# Patient Record
Sex: Male | Born: 1995 | Race: Black or African American | Hispanic: No | Marital: Single | Smoking: Current some day smoker
Health system: Southern US, Community
[De-identification: ages and names within clinical notes are randomized; demographics above are authoritative.]

## PROBLEM LIST (undated history)

## (undated) ENCOUNTER — Emergency Department (HOSPITAL_COMMUNITY): Discharge status: Absent without leave | Payer: Self-pay

## (undated) DIAGNOSIS — J45909 Unspecified asthma, uncomplicated: Secondary | ICD-10-CM

---
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
## 2017-02-01 ENCOUNTER — Emergency Department (HOSPITAL_COMMUNITY)
Admission: EM | Admit: 2017-02-01 | Discharge: 2017-02-01 | Disposition: A | Discharge status: Home / Self Care | Payer: Medicaid Other | Attending: Emergency Medicine | Admitting: Emergency Medicine

## 2017-02-01 ENCOUNTER — Emergency Department (HOSPITAL_COMMUNITY): Payer: Medicaid Other

## 2017-02-01 ENCOUNTER — Encounter (HOSPITAL_COMMUNITY): Payer: Self-pay | Admitting: Nurse Practitioner

## 2017-02-01 DIAGNOSIS — J45909 Unspecified asthma, uncomplicated: Secondary | ICD-10-CM | POA: Insufficient documentation

## 2017-02-01 DIAGNOSIS — R079 Chest pain, unspecified: Secondary | ICD-10-CM

## 2017-02-01 DIAGNOSIS — F419 Anxiety disorder, unspecified: Secondary | ICD-10-CM | POA: Diagnosis not present

## 2017-02-01 DIAGNOSIS — F17200 Nicotine dependence, unspecified, uncomplicated: Secondary | ICD-10-CM | POA: Diagnosis not present

## 2017-02-01 HISTORY — DX: Unspecified asthma, uncomplicated: J45.909

## 2017-02-01 LAB — CBC
HCT: 45.5 % (ref 39.0–52.0)
HEMOGLOBIN: 15 g/dL (ref 13.0–17.0)
MCH: 27.4 pg (ref 26.0–34.0)
MCHC: 33 g/dL (ref 30.0–36.0)
MCV: 83.2 fL (ref 78.0–100.0)
Platelets: 207 10*3/uL (ref 150–400)
RBC: 5.47 MIL/uL (ref 4.22–5.81)
RDW: 14 % (ref 11.5–15.5)
WBC: 8.4 10*3/uL (ref 4.0–10.5)

## 2017-02-01 LAB — I-STAT TROPONIN, ED: TROPONIN I, POC: 0 ng/mL (ref 0.00–0.08)

## 2017-02-01 LAB — BASIC METABOLIC PANEL
Anion gap: 12 (ref 5–15)
BUN: 11 mg/dL (ref 6–20)
CALCIUM: 9.3 mg/dL (ref 8.9–10.3)
CO2: 23 mmol/L (ref 22–32)
CREATININE: 1.03 mg/dL (ref 0.61–1.24)
Chloride: 105 mmol/L (ref 101–111)
GFR calc Af Amer: >60 mL/min (ref >60)
GLUCOSE: 97 mg/dL (ref 65–99)
Potassium: 3.2 mmol/L — ABNORMAL LOW (ref 3.5–5.1)
Sodium: 140 mmol/L (ref 135–145)

## 2017-02-01 MED ORDER — HYDROXYZINE HCL 25 MG PO TABS: 25.0000 mg | ORAL_TABLET | Freq: Four times a day (QID) | ORAL | 0 refills | Status: DC | PRN

## 2017-02-01 MED ORDER — HYDROXYZINE HCL 25 MG PO TABS: 25.0000 mg | ORAL_TABLET | Freq: Four times a day (QID) | ORAL | 0 refills | Status: DC

## 2017-02-01 NOTE — ED Provider Notes (Signed)
[facility]-EMERGENCY DEPT Provider Note   CSN: [id number] Arrival date & time: 02/01/17  1820     History   Chief Complaint Chief Complaint  Patient presents with  . Chest Pain    HPI Massimo Theissen is a 21 y.o. male.  Patient states that for the past 2 weeks.  He's noticed that he develops chest pain when he becomes anxious or starts worrying about things.  It lasts about an hour.  He goes away with rest.  It does not cause any nausea or shortness of breath. He states he is dressing over his child and the lack of employment. Denies any recreational drug use or alcohol use      Past Medical History:  Diagnosis Date  . Asthma     There are no active problems to display for this patient.   History reviewed. No pertinent surgical history.     Home Medications    Prior to Admission medications   Medication Sig Start Date End Date Taking? Authorizing Provider  hydrOXYzine (ATARAX/VISTARIL) 25 MG tablet Take 1 tablet (25 mg total) by mouth every 6 (six) hours. 02/01/17   Earley Favor, NP  hydrOXYzine (ATARAX/VISTARIL) 25 MG tablet Take 1 tablet (25 mg total) by mouth every 6 (six) hours as needed for anxiety. 02/01/17   Earley Favor, NP    Family History History reviewed. No pertinent family history.  Social History Social History  Substance Use Topics  . Smoking status: Current Some Day Smoker  . Smokeless tobacco: Never Used  . Alcohol use No     Allergies   Patient has no known allergies.   Review of Systems Review of Systems  Respiratory: Negative for shortness of breath.   Cardiovascular: Positive for chest pain.  Gastrointestinal: Negative for nausea.  Neurological: Negative for syncope.  Psychiatric/Behavioral: The patient is nervous/anxious.   All other systems reviewed and are negative.    Physical Exam Updated Vital Signs BP 145/86 (BP Location: Right Arm)   Pulse 89   Temp 98.9 F (37.2 C) (Oral)   Resp 16   Ht 5\' 6"  (1.676 m)   Wt 59 kg    SpO2 100%   BMI 20.98 kg/m   Physical Exam  Constitutional: He appears well-developed and well-nourished.  HENT:  Head: Normocephalic.  Eyes: Pupils are equal, round, and reactive to light.  Cardiovascular: Normal rate.   Pulmonary/Chest: Effort normal.  Abdominal: Soft.  Musculoskeletal: Normal range of motion.  Neurological: He is alert.  Skin: Skin is warm.  Nursing note and vitals reviewed.    ED Treatments / Results  Labs (all labs ordered are listed, but only abnormal results are displayed) Labs Reviewed  BASIC METABOLIC PANEL - Abnormal; Notable for the following:       Result Value   Potassium 3.2 (*)    All other components within normal limits  CBC  I-STAT TROPOININ, ED    EKG  EKG Interpretation None       Radiology Dg Chest 2 View  Result Date: 02/01/2017 CLINICAL DATA:  Chest pain for several days EXAM: CHEST  2 VIEW COMPARISON:  None. FINDINGS: The heart size and mediastinal contours are within normal limits. Both lungs are clear. The visualized skeletal structures are unremarkable. IMPRESSION: No active cardiopulmonary disease. Electronically Signed   By: Alcide Clever M.D.   On: 02/01/2017 19:45    Procedures Procedures (including critical care time)  Medications Ordered in ED Medications - No data to display   Initial Impression /  Assessment and Plan / ED Course  I have reviewed the triage vital signs and the nursing notes.  Pertinent labs & imaging results that were available during my care of the patient were reviewed by me and considered in my medical decision making (see chart for details).      Will prescribe Vistaril when necessary and follow-up with primary care physician I did discuss some deep breathing techniques and meditation Also suggest that when he changes positions that he do this more slowly rather than just pop up and move as he does report that he occasionally will get dizzy when he changes position.  After he is been  sitting or lying for long period of time  Final Clinical Impressions(s) / ED Diagnoses   Final diagnoses:  Chest pain, unspecified type  Anxiety    New Prescriptions New Prescriptions   HYDROXYZINE (ATARAX/VISTARIL) 25 MG TABLET    Take 1 tablet (25 mg total) by mouth every 6 (six) hours.   HYDROXYZINE (ATARAX/VISTARIL) 25 MG TABLET    Take 1 tablet (25 mg total) by mouth every 6 (six) hours as needed for anxiety.     Earley FavorGail Lennart Gladish, NP 02/01/17 2106    Lyndal Pulleyaniel Knott, MD 02/02/17 [phone]

## 2017-02-01 NOTE — ED Triage Notes (Signed)
Pt presents with c/o CP. The pain began about 10 days ago and has been intermittent since onset. The pain is midsternal and radiates into his back. The pain does not seem to be related to any one activity or time of the day. He reports dizziness, SOB. He denies LOC, fevers, cough. He reports recent increased stress in his life.

## 2017-10-05 ENCOUNTER — Emergency Department (HOSPITAL_COMMUNITY)
Admission: EM | Admit: 2017-10-05 | Discharge: 2017-10-05 | Disposition: A | Discharge status: Home / Self Care | Payer: Medicaid Other | Attending: Emergency Medicine | Admitting: Emergency Medicine

## 2017-10-05 DIAGNOSIS — J45909 Unspecified asthma, uncomplicated: Secondary | ICD-10-CM | POA: Diagnosis not present

## 2017-10-05 DIAGNOSIS — Z79899 Other long term (current) drug therapy: Secondary | ICD-10-CM | POA: Insufficient documentation

## 2017-10-05 DIAGNOSIS — J029 Acute pharyngitis, unspecified: Secondary | ICD-10-CM | POA: Diagnosis present

## 2017-10-05 DIAGNOSIS — J020 Streptococcal pharyngitis: Secondary | ICD-10-CM | POA: Insufficient documentation

## 2017-10-05 DIAGNOSIS — F17210 Nicotine dependence, cigarettes, uncomplicated: Secondary | ICD-10-CM | POA: Diagnosis not present

## 2017-10-05 LAB — RAPID STREP SCREEN (MED CTR MEBANE ONLY): Streptococcus, Group A Screen (Direct): POSITIVE — AB

## 2017-10-05 MED ORDER — MORPHINE SULFATE (PF) 4 MG/ML IV SOLN
4.0000 mg | Freq: Once | INTRAVENOUS | Status: AC
  Administered 2017-10-05: 4 mg via INTRAMUSCULAR
  Filled 2017-10-05: qty 1

## 2017-10-05 MED ORDER — IBUPROFEN 600 MG PO TABS: 600.0000 mg | ORAL_TABLET | Freq: Four times a day (QID) | ORAL | 0 refills | Status: DC | PRN

## 2017-10-05 MED ORDER — TRAMADOL HCL 50 MG PO TABS: 50.0000 mg | ORAL_TABLET | Freq: Four times a day (QID) | ORAL | 0 refills | Status: DC | PRN

## 2017-10-05 MED ORDER — PENICILLIN G BENZATHINE 1200000 UNIT/2ML IM SUSP
1.2000 10*6.[IU] | Freq: Once | INTRAMUSCULAR | Status: AC
  Administered 2017-10-05: 1.2 10*6.[IU] via INTRAMUSCULAR
  Filled 2017-10-05: qty 2

## 2017-10-05 MED ORDER — DEXAMETHASONE 4 MG PO TABS
12.0000 mg | ORAL_TABLET | Freq: Once | ORAL | Status: AC
  Administered 2017-10-05: 12 mg via ORAL
  Filled 2017-10-05: qty 3

## 2017-10-05 MED ORDER — IBUPROFEN 400 MG PO TABS
600.0000 mg | ORAL_TABLET | Freq: Once | ORAL | Status: AC
  Administered 2017-10-05: 19:00:00 600 mg via ORAL
  Filled 2017-10-05: qty 1

## 2017-10-05 NOTE — ED Provider Notes (Signed)
[facility] EMERGENCY DEPARTMENT Provider Note   CSN: [id number] Arrival date & time: 10/05/17  1500     History   Chief Complaint Chief Complaint  Patient presents with  . Sore Throat    HPI Fernando Drake is a 21 y.o. male.  HPI   21 year old male sore throat. Onset 2 days ago. Persistent since then. No cough. Subjective fever. Hasn't tried taking anything for her symptoms. Otherwise healthy.  Past Medical History:  Diagnosis Date  . Asthma     There are no active problems to display for this patient.   No past surgical history on file.     Home Medications    Prior to Admission medications   Medication Sig Start Date End Date Taking? Authorizing Provider  hydrOXYzine (ATARAX/VISTARIL) 25 MG tablet Take 1 tablet (25 mg total) by mouth every 6 (six) hours. 02/01/17   Earley Favor, NP  hydrOXYzine (ATARAX/VISTARIL) 25 MG tablet Take 1 tablet (25 mg total) by mouth every 6 (six) hours as needed for anxiety. 02/01/17   Earley Favor, NP    Family History No family history on file.  Social History Social History  Substance Use Topics  . Smoking status: Current Some Day Smoker  . Smokeless tobacco: Never Used  . Alcohol use No     Allergies   Patient has no known allergies.   Review of Systems Review of Systems  All systems reviewed and negative, other than as noted in HPI.  Physical Exam Updated Vital Signs BP 119/66 (BP Location: Left Arm)   Pulse (!) 56   Temp 98.8 F (37.1 C) (Oral)   Resp 12   Ht  (1.702 m)   Wt 63.5 kg (140 lb)   SpO2 99%   BMI 21.93 kg/m   Physical Exam  Constitutional: He appears well-developed and well-nourished. No distress.  HENT:  Head: Normocephalic and atraumatic.  Pharyngitis/tonsillitis. No exudate noted. Uvula midline. Normal sounding voice. Handling secretions. Neck is supple. Tender right-sided cervical adenopathy. No stridor.  Eyes: Conjunctivae are normal. Right eye exhibits no  discharge. Left eye exhibits no discharge.  Neck: Neck supple.  Cardiovascular: Normal rate, regular rhythm and normal heart sounds.  Exam reveals no gallop and no friction rub.   No murmur heard. Pulmonary/Chest: Effort normal and breath sounds normal. No respiratory distress.  Abdominal: Soft. He exhibits no distension. There is no tenderness.  Musculoskeletal: He exhibits no edema or tenderness.  Neurological: He is alert.  Skin: Skin is warm and dry.  Psychiatric: He has a normal mood and affect. His behavior is normal. Thought content normal.  Nursing note and vitals reviewed.    ED Treatments / Results  Labs (all labs ordered are listed, but only abnormal results are displayed) Labs Reviewed  RAPID STREP SCREEN (NOT AT [facility])    EKG  EKG Interpretation None       Radiology No results found.  Procedures Procedures (including critical care time)  Medications Ordered in ED Medications  dexamethasone (DECADRON) tablet 12 mg (not administered)  ibuprofen (ADVIL,MOTRIN) tablet 600 mg (not administered)  morphine 4 MG/ML injection 4 mg (not administered)     Initial Impression / Assessment and Plan / ED Course  I have reviewed the triage vital signs and the nursing notes.  Pertinent labs & imaging results that were available during my care of the patient were reviewed by me and considered in my medical decision making (see chart for details).    Pharyngitis without  evidence or compromise. Nontoxic. Rapid strep positive. IM Bicillin. Sinemet treatment otherwise. Return cautions discussed.  Final Clinical Impressions(s) / ED Diagnoses   Final diagnoses:  Strep throat    New Prescriptions New Prescriptions   No medications on file     Raeford Razor, MD 10/07/17 1205

## 2017-10-05 NOTE — ED Triage Notes (Signed)
Pt c/o of sore throat x 2 days with red inflamed tonsils.

## 2017-11-14 DIAGNOSIS — J029 Acute pharyngitis, unspecified: Secondary | ICD-10-CM | POA: Insufficient documentation

## 2017-11-14 DIAGNOSIS — Z79899 Other long term (current) drug therapy: Secondary | ICD-10-CM | POA: Insufficient documentation

## 2017-11-14 DIAGNOSIS — F17200 Nicotine dependence, unspecified, uncomplicated: Secondary | ICD-10-CM | POA: Diagnosis not present

## 2017-11-14 DIAGNOSIS — J45909 Unspecified asthma, uncomplicated: Secondary | ICD-10-CM | POA: Diagnosis not present

## 2017-11-14 DIAGNOSIS — R070 Pain in throat: Secondary | ICD-10-CM | POA: Diagnosis present

## 2017-11-15 ENCOUNTER — Emergency Department (HOSPITAL_COMMUNITY)
Admission: EM | Admit: 2017-11-15 | Discharge: 2017-11-15 | Disposition: A | Discharge status: Home / Self Care | Payer: Medicaid Other | Attending: Emergency Medicine | Admitting: Emergency Medicine

## 2017-11-15 ENCOUNTER — Other Ambulatory Visit: Payer: Self-pay

## 2017-11-15 ENCOUNTER — Encounter (HOSPITAL_COMMUNITY): Payer: Self-pay | Admitting: Emergency Medicine

## 2017-11-15 DIAGNOSIS — J029 Acute pharyngitis, unspecified: Secondary | ICD-10-CM

## 2017-11-15 LAB — RAPID STREP SCREEN (MED CTR MEBANE ONLY): Streptococcus, Group A Screen (Direct): NEGATIVE

## 2017-11-15 MED ORDER — DEXAMETHASONE 4 MG PO TABS
10.0000 mg | ORAL_TABLET | Freq: Once | ORAL | Status: AC
  Administered 2017-11-15: 10 mg via ORAL
  Filled 2017-11-15: qty 3

## 2017-11-15 NOTE — ED Notes (Signed)
Sherry PA at the bedside at this time.

## 2017-11-15 NOTE — ED Provider Notes (Signed)
[facility]   CSN: [id number] Arrival date & time: 11/14/17  2358     History   Chief Complaint Chief Complaint  Patient presents with  . Sore Throat    HPI Fernando Drake is a 21 y.o. male.  Patient presents with sore throat for the past 3 days. No fever. He denies significant congestion, cough. No nausea or vomiting. He has taken ibuprofen without relief.   The history is provided by the patient. No language interpreter was used.    Past Medical History:  Diagnosis Date  . Asthma     There are no active problems to display for this patient.   History reviewed. No pertinent surgical history.     Home Medications    Prior to Admission medications   Medication Sig Start Date End Date Taking? Authorizing Provider  hydrOXYzine (ATARAX/VISTARIL) 25 MG tablet Take 1 tablet (25 mg total) by mouth every 6 (six) hours. 02/01/17   Earley FavorSchulz, Gail, NP  hydrOXYzine (ATARAX/VISTARIL) 25 MG tablet Take 1 tablet (25 mg total) by mouth every 6 (six) hours as needed for anxiety. 02/01/17   Earley FavorSchulz, Gail, NP  ibuprofen (ADVIL,MOTRIN) 600 MG tablet Take 1 tablet (600 mg total) by mouth every 6 (six) hours as needed. 10/05/17   Raeford RazorKohut, Stephen, MD  traMADol (ULTRAM) 50 MG tablet Take 1 tablet (50 mg total) by mouth every 6 (six) hours as needed. 10/05/17   Raeford RazorKohut, Stephen, MD    Family History No family history on file.  Social History Social History   Tobacco Use  . Smoking status: Current Some Day Smoker  . Smokeless tobacco: Never Used  Substance Use Topics  . Alcohol use: No  . Drug use: No     Allergies   Patient has no known allergies.   Review of Systems Review of Systems  Constitutional: Negative for chills and fever.  HENT: Positive for sore throat. Negative for congestion and trouble swallowing.   Respiratory: Negative for cough and shortness of breath.   Cardiovascular: Negative for chest pain.       Chest  pain with cough.  Gastrointestinal: Negative.  Negative for nausea and vomiting.  Musculoskeletal: Negative.   Skin: Negative.   Neurological: Negative.      Physical Exam Updated Vital Signs BP 117/81 (BP Location: Right Arm)   Pulse 86   Temp 99.3 F (37.4 C) (Oral)   Resp 18   Ht 5\' 6"  (1.676 m)   Wt 68 kg (150 lb)   SpO2 99%   BMI 24.21 kg/m   Physical Exam  Constitutional: He appears well-developed and well-nourished.  HENT:  Head: Normocephalic.  Mouth/Throat: Uvula is midline and mucous membranes are normal. Posterior oropharyngeal erythema present. No oropharyngeal exudate.  Neck: Normal range of motion. Neck supple.  Cardiovascular: Normal rate and regular rhythm.  Pulmonary/Chest: Effort normal and breath sounds normal.  Abdominal: Soft. Bowel sounds are normal. There is no tenderness. There is no rebound and no guarding.  Musculoskeletal: Normal range of motion.  Neurological: He is alert. No cranial nerve deficit.  Skin: Skin is warm and dry. No rash noted.  Psychiatric: He has a normal mood and affect.     ED Treatments / Results  Labs (all labs ordered are listed, but only abnormal results are displayed) Labs Reviewed  RAPID STRE[facility] SCREEN (NOT AT [facility])  CULTURE, GROUP A STREP [facility])    EKG  EKG Interpretation None  Radiology No results found.  Procedures Procedures (including critical care time)  Medications Ordered in ED Medications  dexamethasone (DECADRON) tablet 10 mg (not administered)     Initial Impression / Assessment and Plan / ED Course  I have reviewed the triage vital signs and the nursing notes.  Pertinent labs & imaging results that were available during my care of the patient were reviewed by me and considered in my medical decision making (see chart for details).     Patient with sore throat x 3 days. No fever. Negative strep. No other symptoms. Likely viral requiring supportive management.   Final Clinical  Impressions(s) / ED Diagnoses   Final diagnoses:  Pharyngitis, unspecified etiology    ED Discharge Orders    None       Elpidio AnisUpstill, Keithon Mccoin, PA-C 11/16/17 0041    Geoffery Lyonselo, Douglas, MD 11/16/17 [phone]

## 2017-11-15 NOTE — ED Notes (Signed)
No response when pt. called back to room.

## 2017-11-15 NOTE — Discharge Instructions (Signed)
Continue ibuprofen 600 mg every 6 hours. Drink lots of ice cold fluids, continue to gargle with warm salt water.

## 2017-11-15 NOTE — ED Triage Notes (Signed)
Pt c/o sore throat x 3 days, denies fever, denies n/v/d

## 2017-11-17 LAB — CULTURE, GROUP A STREP (THRC)

## 2018-03-05 ENCOUNTER — Encounter (HOSPITAL_COMMUNITY): Payer: Self-pay

## 2018-03-05 ENCOUNTER — Other Ambulatory Visit: Payer: Self-pay

## 2018-03-05 ENCOUNTER — Emergency Department (HOSPITAL_COMMUNITY)
Admission: EM | Admit: 2018-03-05 | Discharge: 2018-03-05 | Disposition: A | Discharge status: Home / Self Care | Payer: Medicaid Other | Attending: Emergency Medicine | Admitting: Emergency Medicine

## 2018-03-05 ENCOUNTER — Emergency Department (HOSPITAL_COMMUNITY): Payer: Medicaid Other

## 2018-03-05 DIAGNOSIS — F17200 Nicotine dependence, unspecified, uncomplicated: Secondary | ICD-10-CM | POA: Insufficient documentation

## 2018-03-05 DIAGNOSIS — W25XXXA Contact with sharp glass, initial encounter: Secondary | ICD-10-CM | POA: Insufficient documentation

## 2018-03-05 DIAGNOSIS — Y999 Unspecified external cause status: Secondary | ICD-10-CM | POA: Diagnosis not present

## 2018-03-05 DIAGNOSIS — Y9389 Activity, other specified: Secondary | ICD-10-CM | POA: Insufficient documentation

## 2018-03-05 DIAGNOSIS — J45909 Unspecified asthma, uncomplicated: Secondary | ICD-10-CM | POA: Diagnosis not present

## 2018-03-05 DIAGNOSIS — S61012A Laceration without foreign body of left thumb without damage to nail, initial encounter: Secondary | ICD-10-CM | POA: Insufficient documentation

## 2018-03-05 DIAGNOSIS — Z79899 Other long term (current) drug therapy: Secondary | ICD-10-CM | POA: Insufficient documentation

## 2018-03-05 DIAGNOSIS — S6992XA Unspecified injury of left wrist, hand and finger(s), initial encounter: Secondary | ICD-10-CM | POA: Diagnosis present

## 2018-03-05 DIAGNOSIS — S61213A Laceration without foreign body of left middle finger without damage to nail, initial encounter: Secondary | ICD-10-CM | POA: Insufficient documentation

## 2018-03-05 DIAGNOSIS — S61011A Laceration without foreign body of right thumb without damage to nail, initial encounter: Secondary | ICD-10-CM

## 2018-03-05 DIAGNOSIS — Y929 Unspecified place or not applicable: Secondary | ICD-10-CM | POA: Insufficient documentation

## 2018-03-05 DIAGNOSIS — Z23 Encounter for immunization: Secondary | ICD-10-CM | POA: Insufficient documentation

## 2018-03-05 MED ORDER — BACITRACIN ZINC 500 UNIT/GM EX OINT
TOPICAL_OINTMENT | Freq: Two times a day (BID) | CUTANEOUS | Status: DC
  Administered 2018-03-05: 20:00:00 via TOPICAL

## 2018-03-05 MED ORDER — LIDOCAINE HCL (PF) 1 % IJ SOLN
5.0000 mL | Freq: Once | INTRAMUSCULAR | Status: AC
  Administered 2018-03-05: 5 mL
  Filled 2018-03-05: qty 5

## 2018-03-05 MED ORDER — TETANUS-DIPHTH-ACELL PERTUSSIS 5-2.5-18.5 LF-MCG/0.5 IM SUSP
0.5000 mL | Freq: Once | INTRAMUSCULAR | Status: AC
  Administered 2018-03-05: 0.5 mL via INTRAMUSCULAR
  Filled 2018-03-05: qty 0.5

## 2018-03-05 NOTE — Discharge Instructions (Signed)
Follow up for suture removal in 10 days. Return sooner for any problems.
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
## 2018-03-16 ENCOUNTER — Ambulatory Visit (HOSPITAL_COMMUNITY)
Admission: EM | Admit: 2018-03-16 | Discharge: 2018-03-16 | Disposition: A | Discharge status: Home / Self Care | Payer: Medicaid Other

## 2018-03-16 DIAGNOSIS — Z4802 Encounter for removal of sutures: Secondary | ICD-10-CM

## 2018-03-16 NOTE — ED Triage Notes (Signed)
Pt here for suture removal of left thumb.  Wound is clean, dry, intact, and approximated.  No drainage from the wound.  Five sutures were removed from the wound and pt tolerated it well.  Pt instructed on wound care post suture removal.  Pt stated understanding.
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
## 2020-12-02 ENCOUNTER — Encounter (HOSPITAL_COMMUNITY): Payer: Self-pay

## 2020-12-02 ENCOUNTER — Other Ambulatory Visit: Payer: Self-pay

## 2020-12-02 ENCOUNTER — Ambulatory Visit (HOSPITAL_COMMUNITY)
Admission: EM | Admit: 2020-12-02 | Discharge: 2020-12-02 | Disposition: A | Discharge status: Home / Self Care | Payer: Medicaid Other | Attending: Internal Medicine | Admitting: Internal Medicine

## 2020-12-02 DIAGNOSIS — K047 Periapical abscess without sinus: Secondary | ICD-10-CM | POA: Diagnosis not present

## 2020-12-02 MED ORDER — AMOXICILLIN-POT CLAVULANATE 875-125 MG PO TABS: 1.0000 | ORAL_TABLET | Freq: Two times a day (BID) | ORAL | 0 refills | Status: AC

## 2020-12-02 NOTE — ED Provider Notes (Signed)
[facility]    CSN: [id number] Arrival date & time: 12/02/20  1734      History   Chief Complaint Chief Complaint  Patient presents with  . Abscess    Right side bottom gum    HPI Fernando Drake is a 24 y.o. male presents to urgent care with dental pain and right facial swelling.  Patient states symptoms began approximately 4 days ago.  He denies any drainage, no fever or chills.  Patient aware of tooth that needs to be extracted though uncertain where to follow-up with dentist.   Past Medical History:  Diagnosis Date  . Asthma     There are no problems to display for this patient.   History reviewed. No pertinent surgical history.    Home Medications    Prior to Admission medications   Medication Sig Start Date End Date Taking? Authorizing Provider  amoxicillin-clavulanate (AUGMENTIN) 875-125 MG tablet Take 1 tablet by mouth 2 (two) times daily for 14 days. 12/02/20 12/16/20  Rolla Etienne, NP  hydrOXYzine (ATARAX/VISTARIL) 25 MG tablet Take 1 tablet (25 mg total) by mouth every 6 (six) hours. 02/01/17   Earley Favor, NP  hydrOXYzine (ATARAX/VISTARIL) 25 MG tablet Take 1 tablet (25 mg total) by mouth every 6 (six) hours as needed for anxiety. 02/01/17   Earley Favor, NP  ibuprofen (ADVIL,MOTRIN) 600 MG tablet Take 1 tablet (600 mg total) by mouth every 6 (six) hours as needed. 10/05/17   Raeford Razor, MD  traMADol (ULTRAM) 50 MG tablet Take 1 tablet (50 mg total) by mouth every 6 (six) hours as needed. 10/05/17   Raeford Razor, MD    Family History History reviewed. No pertinent family history.  Social History Social History   Tobacco Use  . Smoking status: Current Some Day Smoker  . Smokeless tobacco: Never Used  Substance Use Topics  . Alcohol use: No  . Drug use: No     Allergies   Patient has no known allergies.   Review of Systems As stated in HPI otherwise negative   Physical Exam Triage Vital Signs ED Triage Vitals  Enc  Vitals Group     BP 12/02/20 1745 113/70     Pulse Rate 12/02/20 1745 91     Resp 12/02/20 1745 16     Temp 12/02/20 1745 98.9 F (37.2 C)     Temp Source 12/02/20 1745 Oral     SpO2 12/02/20 1745 96 %     Weight --      Height --      Head Circumference --      Peak Flow --      Pain Score 12/02/20 1747 8     Pain Loc --      Pain Edu? --      Excl. in GC? --    No data found.  Updated Vital Signs BP 113/70 (BP Location: Right Arm)   Pulse 91   Temp 98.9 F (37.2 C) (Oral)   Resp 16   SpO2 96%      Physical Exam Constitutional:      General: He is not in acute distress.    Appearance: Normal appearance. He is not ill-appearing.  HENT:     Mouth/Throat:     Mouth: Mucous membranes are moist.     Pharynx: No oropharyngeal exudate.     Comments: Swelling and tenderness upon palpation to right lower gumline Skin:    General: Skin is warm and dry.  Neurological:     Mental Status: He is alert.  Psychiatric:        Mood and Affect: Mood normal.        Behavior: Behavior normal.      UC Treatments / Results  Labs (all labs ordered are listed, but only abnormal results are displayed) Labs Reviewed - No data to display  EKG   Radiology No results found.  Procedures Procedures (including critical care time)  Medications Ordered in UC Medications - No data to display  Initial Impression / Assessment and Plan / UC Course  I have reviewed the triage vital signs and the nursing notes.  Pertinent labs & imaging results that were available during my care of the patient were reviewed by me and considered in my medical decision making (see chart for details).  Dental abscess -Augmentin p.o. twice daily x14 days -Referral for tooth extraction  Reviewed expections re: course of current medical issues. Questions answered. Outlined signs and symptoms indicating need for more acute intervention. Pt verbalized understanding. AVS given   Final Clinical  Impressions(s) / UC Diagnoses   Final diagnoses:  Dental abscess     Discharge Instructions     Start antibiotic tonight for dental abscess.  I have given you a number to call to schedule appointment to be seen by dentist    ED Prescriptions    Medication Sig Dispense Auth. Provider   amoxicillin-clavulanate (AUGMENTIN) 875-125 MG tablet Take 1 tablet by mouth 2 (two) times daily for 14 days. 28 tablet Rolla Etienne, NP     PDMP not reviewed this encounter.   Rolla Etienne, NP 12/02/20 1927

## 2020-12-02 NOTE — ED Triage Notes (Signed)
PT present an abscess on his bottom gums. Pt states the area is painful. Symptoms started three days ago.

## 2020-12-02 NOTE — Discharge Instructions (Signed)
Start antibiotic tonight for dental abscess.  I have given you a number to call to schedule appointment to be seen by dentist

## 2021-06-09 ENCOUNTER — Ambulatory Visit (HOSPITAL_COMMUNITY)
Admission: EM | Admit: 2021-06-09 | Discharge: 2021-06-09 | Disposition: A | Discharge status: Home / Self Care | Payer: Medicaid Other

## 2022-04-19 ENCOUNTER — Encounter (HOSPITAL_COMMUNITY): Payer: Self-pay | Admitting: Emergency Medicine

## 2022-04-19 ENCOUNTER — Ambulatory Visit (HOSPITAL_COMMUNITY)
Admission: EM | Admit: 2022-04-19 | Discharge: 2022-04-19 | Disposition: A | Discharge status: Home / Self Care | Payer: Medicaid Other | Attending: Urgent Care | Admitting: Urgent Care

## 2022-04-19 ENCOUNTER — Other Ambulatory Visit: Payer: Self-pay

## 2022-04-19 DIAGNOSIS — J029 Acute pharyngitis, unspecified: Secondary | ICD-10-CM | POA: Diagnosis present

## 2022-04-19 LAB — POCT RAPID STREP A, ED / UC: Streptococcus, Group A Screen (Direct): NEGATIVE

## 2022-04-19 MED ORDER — AMOXICILLIN-POT CLAVULANATE 875-125 MG PO TABS: 1.0000 | ORAL_TABLET | Freq: Two times a day (BID) | ORAL | 0 refills | Status: DC

## 2022-04-19 MED ORDER — PREDNISONE 50 MG PO TABS: 50.0000 mg | ORAL_TABLET | Freq: Every day | ORAL | 0 refills | Status: AC

## 2022-04-19 NOTE — ED Provider Notes (Signed)
?[facility] ? ? ? ?CSN: [id number] ?Arrival date & time: 04/19/22  1004 ? ? ?  ? ?History   ?Chief Complaint ?Chief Complaint  ?Patient presents with  ? Otalgia  ? Sore Throat  ? ? ?HPI ?Fernando Drake is a 26 y.o. male.  ? ?Pleasant 26 year old male presents today with primary complaint of sore throat leading to left ear pain.  He states it started 1 week ago.  He has not been taking over-the-counter medications, he states he "thought it would just go away".  He states the pain is worse on the left side but does hurt him bilaterally.  He denies headache, fever, nuchal rigidity, abdominal pain, nausea, vomiting.  He does not have a cough.  He denies any known exposures. ? ? ?Otalgia ?Associated symptoms: sore throat   ?Sore Throat ? ? ?Past Medical History:  ?Diagnosis Date  ? Asthma   ? ? ?There are no problems to display for this patient. ? ? ?History reviewed. No pertinent surgical history. ? ? ? ? ?Home Medications   ? ?Prior to Admission medications   ?Medication Sig Start Date End Date Taking? Authorizing Provider  ?amoxicillin-clavulanate (AUGMENTIN) 875-125 MG tablet Take 1 tablet by mouth every 12 (twelve) hours. 04/19/22  Yes Cristan Scherzer L, PA  ?predniSONE (DELTASONE) 50 MG tablet Take 1 tablet (50 mg total) by mouth daily with breakfast for 3 days. 04/19/22 04/22/22 Yes Danis Pembleton L, PA  ?hydrOXYzine (ATARAX/VISTARIL) 25 MG tablet Take 1 tablet (25 mg total) by mouth every 6 (six) hours. 02/01/17   Earley FavorSchulz, Gail, NP  ?hydrOXYzine (ATARAX/VISTARIL) 25 MG tablet Take 1 tablet (25 mg total) by mouth every 6 (six) hours as needed for anxiety. 02/01/17   Earley FavorSchulz, Gail, NP  ?ibuprofen (ADVIL,MOTRIN) 600 MG tablet Take 1 tablet (600 mg total) by mouth every 6 (six) hours as needed. 10/05/17   Raeford RazorKohut, Stephen, MD  ?traMADol (ULTRAM) 50 MG tablet Take 1 tablet (50 mg total) by mouth every 6 (six) hours as needed. 10/05/17   Raeford RazorKohut, Stephen, MD  ? ? ?Family History ?History reviewed. No pertinent family  history. ? ?Social History ?Social History  ? ?Tobacco Use  ? Smoking status: Some Days  ? Smokeless tobacco: Never  ?Substance Use Topics  ? Alcohol use: No  ? Drug use: No  ? ? ? ?Allergies   ?Patient has no known allergies. ? ? ?Review of Systems ?Review of Systems  ?HENT:  Positive for ear pain and sore throat.   ?All other systems reviewed and are negative. ? ? ?Physical Exam ?Triage Vital Signs ?ED Triage Vitals  ?Enc Vitals Group  ?   BP 04/19/22 1025 136/77  ?   Pulse Rate 04/19/22 1025 62  ?   Resp 04/19/22 1025 17  ?   Temp 04/19/22 1025 98.4 ?F (36.9 ?C)  ?   Temp Source 04/19/22 1025 Oral  ?   SpO2 04/19/22 1025 96 %  ?   Weight 04/19/22 1024 149 lb 14.6 oz (68 kg)  ?   Height 04/19/22 1024 5\' 6"  (1.676 m)  ?   Head Circumference --   ?   Peak Flow --   ?   Pain Score 04/19/22 1023 10  ?   Pain Loc --   ?   Pain Edu? --   ?   Excl. in GC? --   ? ?No data found. ? ?Updated Vital Signs ?BP 136/77 (BP Location: Left Arm)   Pulse 62  Temp 98.4 ?F (36.9 ?C) (Oral)   Resp 17   Ht 5\' 6"  (1.676 m)   Wt 149 lb 14.6 oz (68 kg)   SpO2 96%   BMI 24.20 kg/m?  ? ?Visual Acuity ?Right Eye Distance:   ?Left Eye Distance:   ?Bilateral Distance:   ? ?Right Eye Near:   ?Left Eye Near:    ?Bilateral Near:    ? ?Physical Exam ?Vitals and nursing note reviewed.  ?Constitutional:   ?   General: He is not in acute distress. ?   Appearance: He is well-developed and normal weight. He is not ill-appearing or toxic-appearing.  ?HENT:  ?   Head: Normocephalic and atraumatic.  ?   Right Ear: Tympanic membrane and ear canal normal. No drainage, swelling or tenderness. No middle ear effusion. Tympanic membrane is not erythematous.  ?   Left Ear: Tympanic membrane and ear canal normal. No drainage, swelling or tenderness.  No middle ear effusion. Tympanic membrane is not erythematous.  ?   Nose: No congestion or rhinorrhea.  ?   Mouth/Throat:  ?   Mouth: Mucous membranes are moist. No oral lesions.  ?   Pharynx: Uvula midline.  Oropharyngeal exudate and posterior oropharyngeal erythema present. No pharyngeal swelling or uvula swelling.  ?   Tonsils: Tonsillar exudate present. No tonsillar abscesses.  ?   Comments: Bilateral rise of palate, no abscess ?Eyes:  ?   Conjunctiva/sclera: Conjunctivae normal.  ?   Pupils: Pupils are equal, round, and reactive to light.  ?Neck:  ?   Thyroid: No thyromegaly.  ?Cardiovascular:  ?   Rate and Rhythm: Normal rate and regular rhythm.  ?   Heart sounds: No murmur heard. ?  No gallop.  ?Pulmonary:  ?   Effort: Pulmonary effort is normal. No respiratory distress.  ?   Breath sounds: Normal breath sounds. No stridor. No wheezing, rhonchi or rales.  ?Chest:  ?   Chest wall: No tenderness.  ?Abdominal:  ?   General: Bowel sounds are normal. There is no distension.  ?   Palpations: Abdomen is soft. There is no mass.  ?   Tenderness: There is no abdominal tenderness. There is no rebound.  ?Musculoskeletal:  ?   Cervical back: Normal range of motion and neck supple.  ?Lymphadenopathy:  ?   Cervical: No cervical adenopathy (bilateral anterior chain, L>R).  ?Skin: ?   General: Skin is warm.  ?   Capillary Refill: Capillary refill takes less than 2 seconds.  ?   Findings: No erythema or rash.  ?Neurological:  ?   General: No focal deficit present.  ?   Mental Status: He is alert and oriented to person, place, and time.  ?Psychiatric:     ?   Mood and Affect: Mood normal.     ?   Behavior: Behavior normal.  ? ? ? ?UC Treatments / Results  ?Labs ?(all labs ordered are listed, but only abnormal results are displayed) ?Labs Reviewed  ?CULTURE, GROUP A STREP [facility])  ?POCT RAPID STREP A, ED / UC  ? ? ?EKG ? ? ?Radiology ?No results found. ? ?Procedures ?Procedures (including critical care time) ? ?Medications Ordered in UC ?Medications - No data to display ? ?Initial Impression / Assessment and Plan / UC Course  ?I have reviewed the triage vital signs and the nursing notes. ? ?Pertinent labs & imaging results that were  available during my care of the patient were reviewed by me and considered in my  medical decision making (see chart for details). ? ?  ? ?Acute pharyngitis - Rapid strep negative, sx >1 week. No red flag s/sx, but will start empiric abx to cover for suspected bacterial pathogens. Throat culture ordered. Three days of prednisone to help with swelling/ lymphadenopathy. ? ?Final Clinical Impressions(s) / UC Diagnoses  ? ?Final diagnoses:  ?Acute pharyngitis, unspecified etiology  ? ? ? ?Discharge Instructions   ? ?  ?Your strep test is negative. We will call with the results of the throat swab. Will start you on empiric antibiotics as I suspect this to be bacterial. ?Given your swollen glands and pain with swallowing, I have also called in three days of prednisone to help with that. ?Please use 500mg  tylenol was needed for throat discomfort or fever. ?You may also try chloraseptic spray or cepacol lozenges. Salt water gargles may help. ?Monitor for fever >100.3, swollen lymph nodes or white spots on the back of the throat which may indicate a need to return to our clinic.  ? ? ? ? ?ED Prescriptions   ? ? Medication Sig Dispense Auth. Provider  ? amoxicillin-clavulanate (AUGMENTIN) 875-125 MG tablet Take 1 tablet by mouth every 12 (twelve) hours. 14 tablet Lajoy Vanamburg L, PA  ? predniSONE (DELTASONE) 50 MG tablet Take 1 tablet (50 mg total) by mouth daily with breakfast for 3 days. 3 tablet Pine, Ed Mandich L, PA  ? ?  ? ?PDMP not reviewed this encounter. ?  Koilineia, Maretta Bees ?04/19/22 1052 ? ?

## 2022-04-19 NOTE — ED Triage Notes (Signed)
Pt reports sore throat and left ear pain x 1 week. States any time he tries to swallow its very painful.  ?

## 2022-04-19 NOTE — Discharge Instructions (Addendum)
Your strep test is negative. We will call with the results of the throat swab. Will start you on empiric antibiotics as I suspect this to be bacterial. ?Given your swollen glands and pain with swallowing, I have also called in three days of prednisone to help with that. ?Please use 500mg  tylenol was needed for throat discomfort or fever. ?You may also try chloraseptic spray or cepacol lozenges. Salt water gargles may help. ?Monitor for fever >100.3, swollen lymph nodes or white spots on the back of the throat which may indicate a need to return to our clinic.  ?

## 2022-04-21 LAB — CULTURE, GROUP A STREP (THRC)

## 2022-04-21 LAB — POCT URINALYSIS DIPSTICK, ED / UC
Bilirubin Urine: NEGATIVE
Glucose, UA: NEGATIVE mg/dL
Hgb urine dipstick: NEGATIVE
Ketones, ur: NEGATIVE mg/dL
Nitrite: NEGATIVE
Protein, ur: NEGATIVE mg/dL
Specific Gravity, Urine: 1.015 (ref 1.005–1.030)
Urobilinogen, UA: 0.2 mg/dL (ref 0.0–1.0)
pH: 8.5 — ABNORMAL HIGH (ref 5.0–8.0)

## 2023-01-07 ENCOUNTER — Encounter (HOSPITAL_COMMUNITY): Payer: Self-pay

## 2023-01-07 ENCOUNTER — Ambulatory Visit (HOSPITAL_COMMUNITY)
Admission: EM | Admit: 2023-01-07 | Discharge: 2023-01-07 | Disposition: A | Discharge status: Home / Self Care | Payer: Medicaid Other | Attending: Internal Medicine | Admitting: Internal Medicine

## 2023-01-07 DIAGNOSIS — H6123 Impacted cerumen, bilateral: Secondary | ICD-10-CM | POA: Diagnosis not present

## 2023-01-07 NOTE — ED Provider Notes (Signed)
[place]   [id number] 01/07/23 Arrival Time: 5427  ASSESSMENT & PLAN:  1. Bilateral impacted cerumen    -Found to have bilateral cerumen impaction, left greater than right.  Nurse performed bilateral ear irrigation in the exam room today.  Patient felt much better after.  Exam was performed status post irrigation and tympanic membranes were intact bilaterally. Instructed he can take tylenol as needed for pain post irrigation.  Follow-up as needed.  No orders of the defined types were placed in this encounter.  Discharge Instructions   None       Reviewed expectations re: course of current medical issues. Questions answered. Outlined signs and symptoms indicating need for more acute intervention. Patient verbalized understanding. After Visit Summary given.   SUBJECTIVE:  Pleasant 27 year old male comes urgent care to be evaluated for left ear issue.  He says he has had decreased hearing in his left ear for the past 2 months.  No inciting injury or trauma to the area.  He says the ear feels stopped up.  He is unable to "pop" it.  He says that sounds like there is an echo in his left ear.  Denies any discharge from the ear.  He reports no issues with the right ear.  Denies any associated symptoms such as fever, headache, sore throat, rhinorrhea.  No LMP for male patient. History reviewed. No pertinent surgical history.   OBJECTIVE:  Vitals:   01/07/23 0909 01/07/23 0910  BP:  (!) 121/58  Pulse:  (!) 50  Resp:  16  Temp:  98.1 F (36.7 C)  TempSrc:  Oral  SpO2:  97%  Weight: 72.6 kg   Height: 5\' 9"  (1.753 m)      Physical Exam Vitals and nursing note reviewed.  Constitutional:      General: He is not in acute distress.    Appearance: Normal appearance.  HENT:     Head: Normocephalic.     Right Ear: Tympanic membrane and external ear normal. There is impacted cerumen.     Left Ear: External ear normal. There is impacted cerumen.  Cardiovascular:      Rate and Rhythm: Normal rate.  Pulmonary:     Effort: Pulmonary effort is normal.  Musculoskeletal:        General: Normal range of motion.  Neurological:     General: No focal deficit present.  Psychiatric:        Mood and Affect: Mood normal.      Labs: Results for orders placed or performed during the hospital encounter of 04/19/22  Culture, group A strep (throat)   Specimen: Throat  Result Value Ref Range   Specimen Description THROAT    Special Requests NONE    Culture      NO GROUP A STREP (S.PYOGENES) ISOLATED Performed at [facility], [place] [address] [place], [place] [address]    Report Status 04/21/2022 FINAL   POCT Rapid Strep A (UC)  Result Value Ref Range   Streptococcus, Group A Screen (Direct) NEGATIVE NEGATIVE  POCT Urinalysis Dipstick (ED/UC)  Result Value Ref Range   Glucose, UA NEGATIVE NEGATIVE mg/dL   Bilirubin Urine NEGATIVE NEGATIVE   Ketones, ur NEGATIVE NEGATIVE mg/dL   Specific Gravity, Urine 1.015 1.005 - 1.030   Hgb urine dipstick NEGATIVE NEGATIVE   pH 8.5 (H) 5.0 - 8.0   Protein, ur NEGATIVE NEGATIVE mg/dL   Urobilinogen, UA 0.2 0.0 - 1.0 mg/dL   Nitrite NEGATIVE NEGATIVE   Leukocytes,Ua  TRACE (A) NEGATIVE   Labs Reviewed - No data to display  Imaging: No results found.   No Known Allergies                                             Past Medical History:  Diagnosis Date   Asthma     Social History   Socioeconomic History   Marital status: Single    Spouse name: Not on file   Number of children: Not on file   Years of education: Not on file   Highest education level: Not on file  Occupational History   Not on file  Tobacco Use   Smoking status: Some Days   Smokeless tobacco: Never  Substance and Sexual Activity   Alcohol use: No   Drug use: No   Sexual activity: Not on file  Other Topics Concern   Not on file  Social History Narrative   Not on file   Social Determinants of Health   Financial Resource  Strain: Not on file  Food Insecurity: Not on file  Transportation Needs: Not on file  Physical Activity: Not on file  Stress: Not on file  Social Connections: Not on file  Intimate Partner Violence: Not on file    History reviewed. No pertinent family history.    Amardeep Beckers, Dorian Pod, MD 01/07/23 1031

## 2023-01-07 NOTE — ED Triage Notes (Signed)
Chief Complaint: pain in the left ear and fullness. No known injuries. Patient does use q-tips and bobby pins.   Onset: 2 months.   Prescriptions or OTC medications tried: No

## 2023-11-29 ENCOUNTER — Encounter (HOSPITAL_COMMUNITY): Payer: Self-pay | Admitting: Emergency Medicine

## 2023-11-29 ENCOUNTER — Ambulatory Visit (HOSPITAL_COMMUNITY)
Admission: EM | Admit: 2023-11-29 | Discharge: 2023-11-29 | Disposition: A | Discharge status: Home / Self Care | Payer: BLUE CROSS/BLUE SHIELD | Attending: Emergency Medicine | Admitting: Emergency Medicine

## 2023-11-29 DIAGNOSIS — L03011 Cellulitis of right finger: Secondary | ICD-10-CM

## 2023-11-29 MED ORDER — CHLORHEXIDINE GLUCONATE 4 % EX SOLN: Freq: Every day | CUTANEOUS | 0 refills | Status: AC | PRN

## 2023-11-29 MED ORDER — AMOXICILLIN-POT CLAVULANATE 875-125 MG PO TABS: 1.0000 | ORAL_TABLET | Freq: Two times a day (BID) | ORAL | 0 refills | Status: DC

## 2023-11-29 MED ORDER — IBUPROFEN 800 MG PO TABS
800.0000 mg | ORAL_TABLET | Freq: Once | ORAL | Status: AC
  Administered 2023-11-29: 800 mg via ORAL

## 2023-11-29 MED ORDER — LIDOCAINE-EPINEPHRINE-TETRACAINE (LET) TOPICAL GEL
3.0000 mL | Freq: Once | TOPICAL | Status: AC
  Administered 2023-11-29: 3 mL via TOPICAL

## 2023-11-29 MED ORDER — IBUPROFEN 800 MG PO TABS: 800.0000 mg | ORAL_TABLET | Freq: Three times a day (TID) | ORAL | 0 refills | Status: AC

## 2023-11-29 MED ORDER — LIDOCAINE-EPINEPHRINE-TETRACAINE (LET) TOPICAL GEL
TOPICAL | Status: AC
  Filled 2023-11-29: qty 3

## 2023-11-29 MED ORDER — IBUPROFEN 800 MG PO TABS
ORAL_TABLET | ORAL | Status: AC
  Filled 2023-11-29: qty 1

## 2023-11-29 NOTE — ED Triage Notes (Signed)
Pt c/o right 4th finger pain and swelling.

## 2023-11-29 NOTE — Discharge Instructions (Addendum)
We drained the infected area at the base of your nail.  It may continue to drain.  Please soak your hand 2-3 times daily for 10 to 15 minutes in warm water with antibacterial like Hibiclens or Dial soap.  You can take the ibuprofen every 8 hours for pain and inflammation.  Take all antibiotics as prescribed until finished to help prevent further infection.  Avoid biting your nails in the future.  Return to clinic for any new or concerning symptoms.

## 2023-11-29 NOTE — ED Provider Notes (Signed)
[facility]    CSN: [id number] Arrival date & time: 11/29/23  1112      History   Chief Complaint Chief Complaint  Patient presents with   Hand Pain    HPI Fernando Drake is a 27 y.o. male.   Patient presents to clinic over complaints of right fourth finger pain and swelling around the nailbed.  He does bite his nails.  Reports he knows it is infected.  The distal finger is painful and swollen.  He has not tried anything at home for this or taken any medications.  He has not had any injury or trauma.  The history is provided by the patient and medical records.  Hand Pain    Past Medical History:  Diagnosis Date   Asthma     There are no problems to display for this patient.   History reviewed. No pertinent surgical history.     Home Medications    Prior to Admission medications   Medication Sig Start Date End Date Taking? Authorizing Provider  amoxicillin-clavulanate (AUGMENTIN) 875-125 MG tablet Take 1 tablet by mouth every 12 (twelve) hours. 11/29/23  Yes Rinaldo Ratel, [place] N, FNP  chlorhexidine (HIBICLENS) 4 % external liquid Apply topically daily as needed. 11/29/23  Yes Rinaldo Ratel, [place] N, FNP  ibuprofen (ADVIL) 800 MG tablet Take 1 tablet (800 mg total) by mouth 3 (three) times daily. 11/29/23  Yes Benjamim Harnish, [place] N, FNP    Family History No family history on file.  Social History Social History   Tobacco Use   Smoking status: Some Days   Smokeless tobacco: Never  Substance Use Topics   Alcohol use: No   Drug use: No     Allergies   Patient has no known allergies.   Review of Systems Review of Systems  Per HPI   Physical Exam Triage Vital Signs ED Triage Vitals  Encounter Vitals Group     BP 11/29/23 1152 (!) 148/100     Systolic BP Percentile --      Diastolic BP Percentile --      Pulse Rate 11/29/23 1152 62     Resp 11/29/23 1152 16     Temp 11/29/23 1152 97.9 F (36.6 C)     Temp Source 11/29/23 1152 Oral      SpO2 11/29/23 1152 96 %     Weight --      Height --      Head Circumference --      Peak Flow --      Pain Score 11/29/23 1151 10     Pain Loc --      Pain Education --      Exclude from Growth Chart --    No data found.  Updated Vital Signs BP (!) 148/100 (BP Location: Left Arm)   Pulse 62   Temp 97.9 F (36.6 C) (Oral)   Resp 16   SpO2 96%   Visual Acuity Right Eye Distance:   Left Eye Distance:   Bilateral Distance:    Right Eye Near:   Left Eye Near:    Bilateral Near:     Physical Exam Vitals and nursing note reviewed.  Constitutional:      Appearance: Normal appearance.  HENT:     Head: Normocephalic and atraumatic.     Right Ear: External ear normal.     Left Ear: External ear normal.     Nose: Nose normal.     Mouth/Throat:     Mouth: Mucous  membranes are moist.  Eyes:     Conjunctiva/sclera: Conjunctivae normal.  Cardiovascular:     Rate and Rhythm: Normal rate.     Pulses: Normal pulses.  Pulmonary:     Effort: Pulmonary effort is normal. No respiratory distress.  Musculoskeletal:        General: Normal range of motion.  Skin:    General: Skin is warm and dry.     Capillary Refill: Capillary refill takes less than 2 seconds.     Comments: Paronychia to right 4th finger nail bed with associated soft tissue swelling and erythema.   Neurological:     General: No focal deficit present.     Mental Status: He is alert.  Psychiatric:        Mood and Affect: Mood normal.      UC Treatments / Results  Labs (all labs ordered are listed, but only abnormal results are displayed) Labs Reviewed - No data to display  EKG   Radiology No results found.  Procedures Incision and Drainage  Date/Time: 11/29/2023 12:40 PM  Performed by: Kemuel Buchmann, [place] N, FNP Authorized by: Denessa Cavan, [place] N, FNP   Consent:    Consent obtained:  Verbal   Consent given by:  Patient   Risks, benefits, and alternatives were discussed: yes     Risks discussed:   Bleeding, pain and incomplete drainage   Alternatives discussed:  No treatment and delayed treatment Universal protocol:    Procedure explained and questions answered to patient or proxy's satisfaction: yes     Patient identity confirmed:  Verbally with patient Location:    Type:  Abscess (paronychia)   Location:  Upper extremity   Upper extremity location:  Finger   Finger location:  R ring finger Pre-procedure details:    Skin preparation:  Chlorhexidine with alcohol Sedation:    Sedation type:  None Anesthesia:    Anesthesia method:  Topical application   Topical anesthetic:  LET Procedure type:    Complexity:  Simple Procedure details:    Incision types:  Stab incision   Drainage:  Purulent and bloody   Drainage amount:  Moderate   Wound treatment:  Wound left open   Packing materials:  None Post-procedure details:    Procedure completion:  Tolerated well, no immediate complications  (including critical care time)  Medications Ordered in UC Medications  lidocaine-EPINEPHrine-tetracaine (LET) topical gel (3 mLs Topical Given 11/29/23 1213)  ibuprofen (ADVIL) tablet 800 mg (800 mg Oral Given 11/29/23 1213)    Initial Impression / Assessment and Plan / UC Course  I have reviewed the triage vital signs and the nursing notes.  Pertinent labs & imaging results that were available during my care of the patient were reviewed by me and considered in my medical decision making (see chart for details).  Vitals and triage reviewed, patient is hemodynamically stable.  Paronychia to right ring finger.  Numbed, cleaned and drained.  See procedure note for further details.  Will cover with Augmentin due to presumed oral flora with nailbiting.  Wound care discussed.  Pain management reviewed.  Plan of care, follow-up care return precautions given, no questions at this time.     Final Clinical Impressions(s) / UC Diagnoses   Final diagnoses:  Paronychia of finger of right hand      Discharge Instructions      We drained the infected area at the base of your nail.  It may continue to drain.  Please soak your hand 2-3 times daily  for 10 to 15 minutes in warm water with antibacterial like Hibiclens or Dial soap.  You can take the ibuprofen every 8 hours for pain and inflammation.  Take all antibiotics as prescribed until finished to help prevent further infection.  Avoid biting your nails in the future.  Return to clinic for any new or concerning symptoms.     ED Prescriptions     Medication Sig Dispense Auth. Provider   amoxicillin-clavulanate (AUGMENTIN) 875-125 MG tablet Take 1 tablet by mouth every 12 (twelve) hours. 14 tablet Rinaldo Ratel, [place] N, [place]   chlorhexidine (HIBICLENS) 4 % external liquid Apply topically daily as needed. 118 mL Rinaldo Ratel, [place] N, [place]   ibuprofen (ADVIL) 800 MG tablet Take 1 tablet (800 mg total) by mouth 3 (three) times daily. 21 tablet Rosezella Kronick, [place] N, [place]      PDMP not reviewed this encounter.   Lesette Frary, [place] N, [place] 11/29/23 [phone]

## 2024-10-29 ENCOUNTER — Ambulatory Visit (HOSPITAL_COMMUNITY)
Admission: EM | Admit: 2024-10-29 | Discharge: 2024-10-29 | Disposition: A | Discharge status: Home / Self Care | Attending: Physician Assistant | Admitting: Physician Assistant

## 2024-10-29 ENCOUNTER — Encounter (HOSPITAL_COMMUNITY): Payer: Self-pay

## 2024-10-29 ENCOUNTER — Other Ambulatory Visit: Payer: Self-pay

## 2024-10-29 DIAGNOSIS — H938X2 Other specified disorders of left ear: Secondary | ICD-10-CM

## 2024-10-29 DIAGNOSIS — H6992 Unspecified Eustachian tube disorder, left ear: Secondary | ICD-10-CM | POA: Diagnosis not present

## 2024-10-29 MED ORDER — FLUTICASONE PROPIONATE 50 MCG/ACT NA SUSP: 1.0000 | Freq: Every day | NASAL | 0 refills | Status: AC

## 2024-10-29 MED ORDER — LEVOCETIRIZINE DIHYDROCHLORIDE 5 MG PO TABS: 5.0000 mg | ORAL_TABLET | Freq: Every evening | ORAL | 0 refills | Status: AC

## 2024-10-29 NOTE — Discharge Instructions (Signed)
 I do not see any evidence of an infection on exam.  I believe that you have eustachian tube dysfunction caused by seasonal allergies.  Start Xyzal at night.  Use fluticasone nasal spray daily.  If your symptoms do not improve within a few weeks please return.  If anything worsens and you have drainage from the ear, ear pain, fever, nausea/vomiting you should return for reevaluation.

## 2024-10-29 NOTE — ED Provider Notes (Signed)
 [facility]    CSN: [id number] Arrival date & time: 10/29/24  9178      History   Chief Complaint No chief complaint on file.   HPI Fernando Drake is a 28 y.o. male.   Patient presents today with a weeklong history of intermittent fullness and clogged sensation in his left ear.  He denies any associated pain.  Denies any recent illness or additional symptoms including cough, congestion, fever, nausea, vomiting.  Denies any recent injury to his ear or otorrhea.  He does not use Q-tips, earplugs, earplugs on a regular basis.  He has had cerumen impaction in the past and wonders if this could be contributing to his symptoms.  He has not taken any over-the-counter medication for symptom management.  Denies any recent airplane travel or swimming.    Past Medical History:  Diagnosis Date   Asthma     There are no active problems to display for this patient.   History reviewed. No pertinent surgical history.     Home Medications    Prior to Admission medications   Medication Sig Start Date End Date Taking? Authorizing Provider  fluticasone (FLONASE) 50 MCG/ACT nasal spray Place 1 spray into both nostrils daily. 10/29/24  Yes Janalee Grobe K, PA-C  levocetirizine (XYZAL) 5 MG tablet Take 1 tablet (5 mg total) by mouth every evening. 10/29/24  Yes Ithiel Liebler K, PA-C  chlorhexidine  (HIBICLENS ) 4 % external liquid Apply topically daily as needed. 11/29/23   Dreama, [place]  N, FNP  ibuprofen  (ADVIL ) 800 MG tablet Take 1 tablet (800 mg total) by mouth 3 (three) times daily. 11/29/23   Dreama, [place]  N, FNP    Family History History reviewed. No pertinent family history.  Social History Social History   Tobacco Use   Smoking status: Some Days    Types: Cigarettes   Smokeless tobacco: Never  Vaping Use   Vaping status: Never Used  Substance Use Topics   Alcohol use: No   Drug use: No     Allergies   Patient has no known allergies.   Review of  Systems Review of Systems  Constitutional:  Negative for activity change, appetite change, fatigue and fever.  HENT:  Positive for hearing loss (Ear fullness/clogged sensation in left ear). Negative for congestion, ear discharge, ear pain, postnasal drip, sinus pressure, sneezing and sore throat.   Respiratory:  Negative for cough.   Cardiovascular:  Negative for chest pain.  Gastrointestinal:  Negative for nausea and vomiting.  Neurological:  Negative for headaches.     Physical Exam Triage Vital Signs ED Triage Vitals  Encounter Vitals Group     BP 10/29/24 0854 126/87     Girls Systolic BP Percentile --      Girls Diastolic BP Percentile --      Boys Systolic BP Percentile --      Boys Diastolic BP Percentile --      Pulse Rate 10/29/24 0854 77     Resp 10/29/24 0854 16     Temp 10/29/24 0854 98.6 F (37 C)     Temp Source 10/29/24 0854 Oral     SpO2 10/29/24 0854 97 %     Weight --      Height --      Head Circumference --      Peak Flow --      Pain Score 10/29/24 0852 0     Pain Loc --      Pain Education --  Exclude from Growth Chart --    No data found.  Updated Vital Signs BP 126/87   Pulse 77   Temp 98.6 F (37 C) (Oral)   Resp 16   SpO2 97%   Visual Acuity Right Eye Distance:   Left Eye Distance:   Bilateral Distance:    Right Eye Near:   Left Eye Near:    Bilateral Near:     Physical Exam Vitals reviewed.  Constitutional:      General: He is awake.     Appearance: Normal appearance. He is well-developed. He is not ill-appearing.     Comments: Very pleasant male appears stated age in no acute distress sitting comfortably in exam room  HENT:     Head: Normocephalic and atraumatic.     Right Ear: Ear canal and external ear normal. A middle ear effusion is present. Tympanic membrane is not erythematous or bulging.     Left Ear: Ear canal and external ear normal. A middle ear effusion is present. Tympanic membrane is not erythematous or  bulging.     Nose: Nose normal.     Mouth/Throat:     Pharynx: Uvula midline. No oropharyngeal exudate, posterior oropharyngeal erythema or uvula swelling.  Cardiovascular:     Rate and Rhythm: Normal rate and regular rhythm.     Heart sounds: Normal heart sounds, S1 normal and S2 normal. No murmur heard. Pulmonary:     Effort: Pulmonary effort is normal. No accessory muscle usage or respiratory distress.     Breath sounds: Normal breath sounds. No stridor. No wheezing, rhonchi or rales.     Comments: Clear to auscultation bilaterally Neurological:     Mental Status: He is alert.  Psychiatric:        Behavior: Behavior is cooperative.      UC Treatments / Results  Labs (all labs ordered are listed, but only abnormal results are displayed) Labs Reviewed - No data to display  EKG   Radiology No results found.  Procedures Procedures (including critical care time)  Medications Ordered in UC Medications - No data to display  Initial Impression / Assessment and Plan / UC Course  I have reviewed the triage vital signs and the nursing notes.  Pertinent labs & imaging results that were available during my care of the patient were reviewed by me and considered in my medical decision making (see chart for details).     Patient is well-appearing, afebrile, nontoxic, nontachycardic.  No evidence of acute infection on physical exam that warranted initiation of antibiotics.  No cerumen impaction.  Suspect eustachian tube dysfunction as etiology of symptoms.  Will start fluticasone nasal spray as well as Xyzal.  Also recommended nasal saline and sinus rinses.  We discussed that if he has any worsening symptoms including otorrhea, otalgia, fever, congestion he should be reevaluated.  Strict return precautions given.  Patient declined excuse note.  Final Clinical Impressions(s) / UC Diagnoses   Final diagnoses:  Dysfunction of left eustachian tube  Ear fullness, left     Discharge  Instructions      I do not see any evidence of an infection on exam.  I believe that you have eustachian tube dysfunction caused by seasonal allergies.  Start Xyzal at night.  Use fluticasone nasal spray daily.  If your symptoms do not improve within a few weeks please return.  If anything worsens and you have drainage from the ear, ear pain, fever, nausea/vomiting you should return for reevaluation.  ED Prescriptions     Medication Sig Dispense Auth. Provider   levocetirizine (XYZAL) 5 MG tablet Take 1 tablet (5 mg total) by mouth every evening. 30 tablet Larnell Granlund K, PA-C   fluticasone (FLONASE) 50 MCG/ACT nasal spray Place 1 spray into both nostrils daily. 16 g Maison Agrusa K, PA-C      PDMP not reviewed this encounter.   Sherrell Rocky POUR, PA-C 10/29/24 9086

## 2024-10-29 NOTE — ED Triage Notes (Signed)
 Pt c/o left ear being [id number]
